# Patient Record
Sex: Female | Born: 1955 | Race: White | Hispanic: No | Smoking: Former smoker
Health system: Southern US, Community
[De-identification: ages and names within clinical notes are randomized; demographics above are authoritative.]

## PROBLEM LIST (undated history)

## (undated) DIAGNOSIS — Z87891 Personal history of nicotine dependence: Secondary | ICD-10-CM

## (undated) DIAGNOSIS — L900 Lichen sclerosus et atrophicus: Secondary | ICD-10-CM

## (undated) DIAGNOSIS — K635 Polyp of colon: Secondary | ICD-10-CM

## (undated) DIAGNOSIS — L918 Other hypertrophic disorders of the skin: Secondary | ICD-10-CM

## (undated) DIAGNOSIS — E785 Hyperlipidemia, unspecified: Secondary | ICD-10-CM

## (undated) DIAGNOSIS — N059 Unspecified nephritic syndrome with unspecified morphologic changes: Secondary | ICD-10-CM

## (undated) HISTORY — DX: Hyperlipidemia, unspecified: E78.5

## (undated) HISTORY — DX: Lichen sclerosus et atrophicus: L90.0

## (undated) HISTORY — PX: DENTAL SURGERY: SHX609

## (undated) HISTORY — DX: Personal history of nicotine dependence: Z87.891

## (undated) HISTORY — DX: Unspecified nephritic syndrome with unspecified morphologic changes: N05.9

## (undated) HISTORY — PX: COLONOSCOPY: SHX174

## (undated) HISTORY — DX: Polyp of colon: K63.5

---
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
## 1999-03-07 ENCOUNTER — Encounter (INDEPENDENT_AMBULATORY_CARE_PROVIDER_SITE_OTHER): Payer: Self-pay | Admitting: Specialist

## 1999-03-07 ENCOUNTER — Ambulatory Visit (HOSPITAL_COMMUNITY): Admission: RE | Admit: 1999-03-07 | Discharge: 1999-03-07 | Payer: Self-pay | Admitting: Internal Medicine

## 2006-10-12 ENCOUNTER — Encounter: Admission: RE | Admit: 2006-10-12 | Discharge: 2006-10-12 | Payer: Self-pay | Admitting: Family Medicine

## 2006-10-28 ENCOUNTER — Encounter: Admission: RE | Admit: 2006-10-28 | Discharge: 2006-10-28 | Payer: Self-pay | Admitting: Family Medicine

## 2006-11-23 ENCOUNTER — Ambulatory Visit (HOSPITAL_COMMUNITY): Admission: RE | Admit: 2006-11-23 | Discharge: 2006-11-23 | Payer: Self-pay | Admitting: Surgery

## 2006-11-23 ENCOUNTER — Encounter (INDEPENDENT_AMBULATORY_CARE_PROVIDER_SITE_OTHER): Payer: Self-pay | Admitting: Surgery

## 2007-02-24 HISTORY — PX: BREAST SURGERY: SHX581

## 2008-08-29 IMAGING — MG MM DUCTOGRAM UNILATERAL*L*
2 series · 2 of 2 positions shown · non-contrast
Comparison: none

BILATERAL BREAST ULTRASOUND
Technologist: Kelvil Raudales, Medical

DUCTOGRAM UNILATERAL *L*
CC and MLO view(s) were taken of the left breast.
BILATERAL  BREAST ULTRASOUND AND LEFT DUCTOGRAM:
CLINICAL DATA: 50-year-old with abnormal screening mammogram 10-12-06.

[L CC]
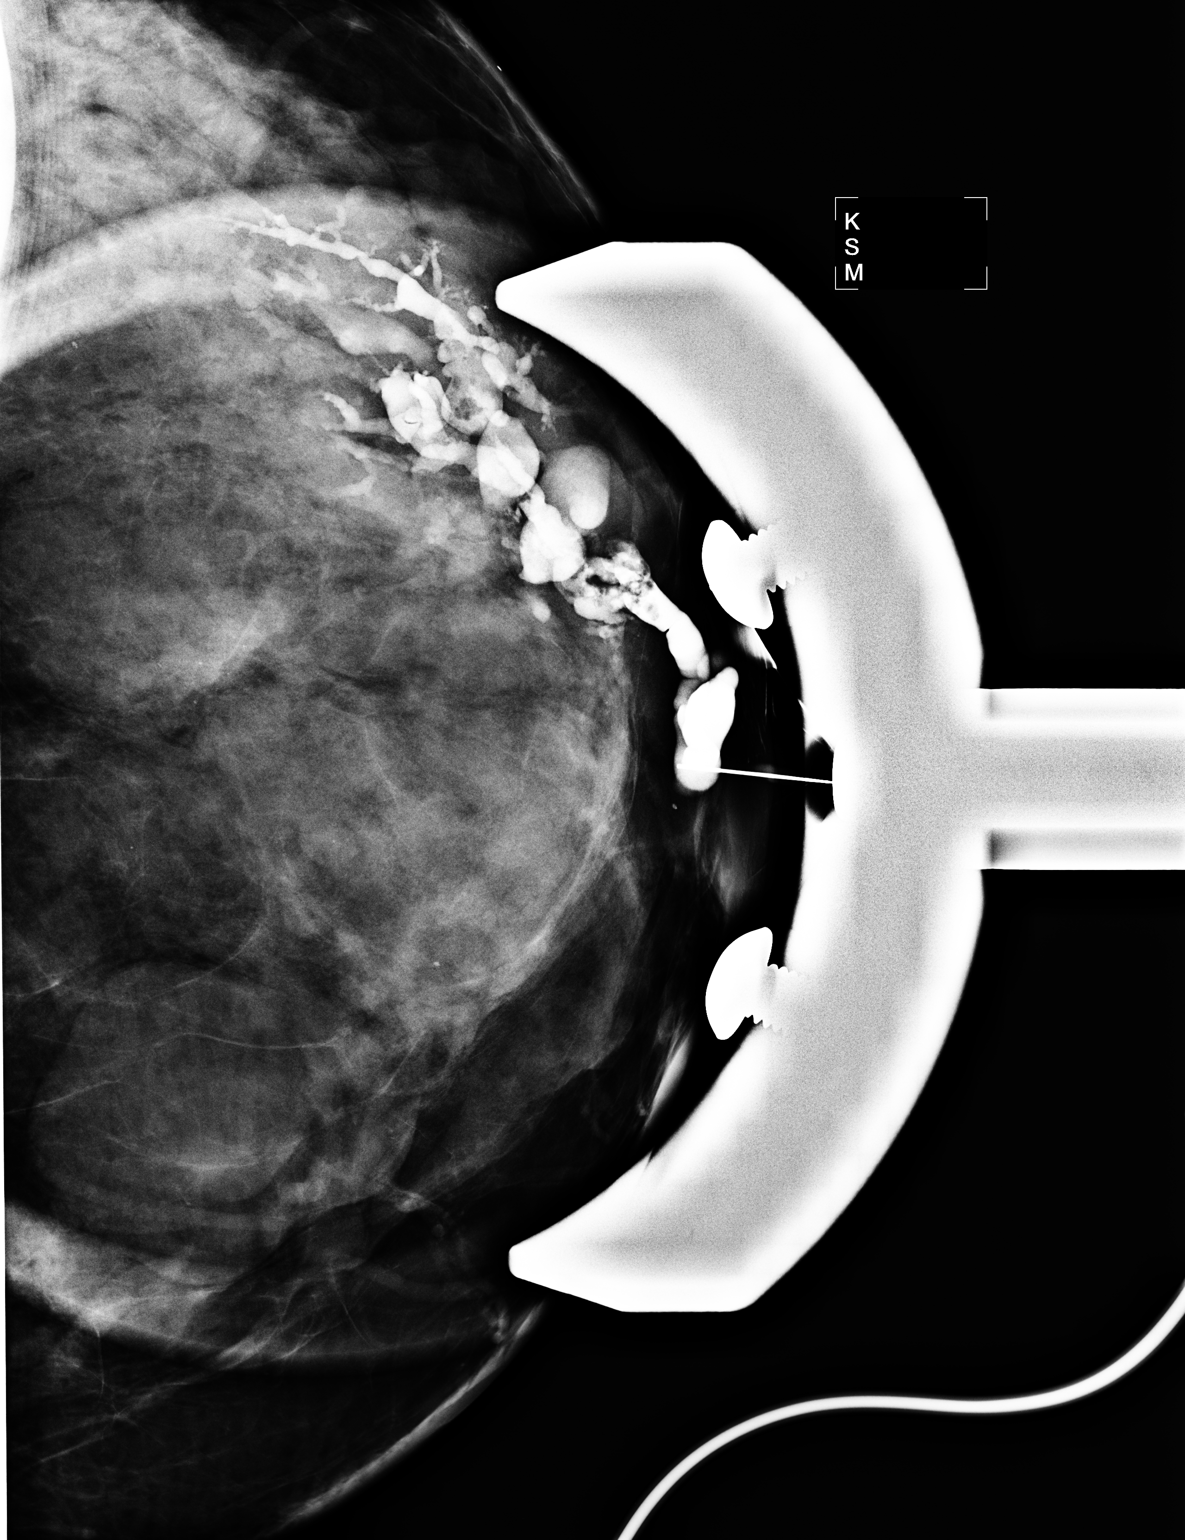

[L ML]
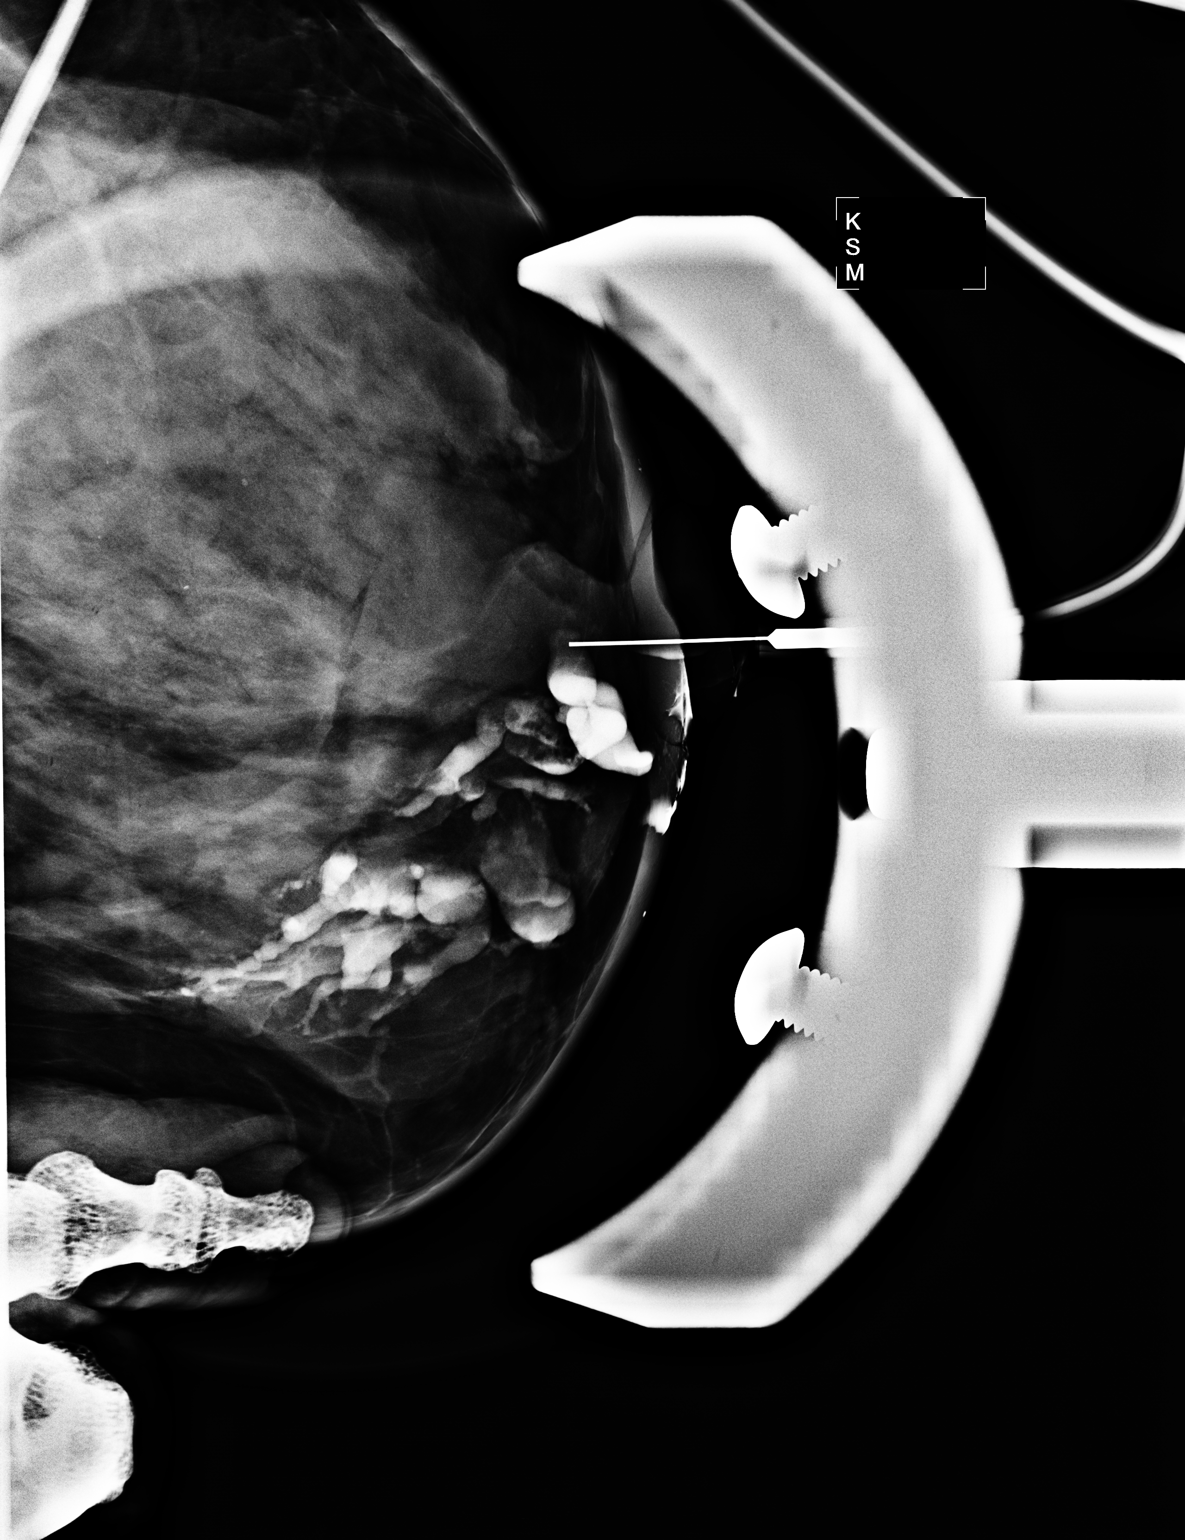

[2 of 2 positions shown; findings below may reference images not displayed]

Comparison with prior mammogram 04-02-99 from [REDACTED].

On physical exam, I palpate no discrete abnormality in either breast.  Ultrasound is performed 
bilaterally, showing numerous benign simple cysts.  The largest on the right is 1.9 cm in the 11 
o'clock position. The largest in the left is in the 10 o'clock position measuring 2.7 cm.  No solid
mass or area of acoustic shadowing is identified.

The patient reports having occasional clear spontaneous drainage on the left. This was further 
evaluated with physical exam and ductogram.  I am able to express a small amount of clear fluid 
from the 2 o'clock location of the left nipple.  Ductogram is performed, showing numerous filling 
defects within the duct system which subtends the lower outer quadrant of the left breast.  
Numerous filling defects are consistent with papillomata.  Excision is suggested.  An appointment 
was made for the patient with Dr. Gregory for surgical consultation regarding duct excision.
IMPRESSION: 1.  Numerous bilateral cysts.
2.  Filling defects within a lower outer quadrant duct system of the left breast accounting for the
spontaneous nipple discharge.  Duct excision is suggested.  Surgical consultation has been 
arranged.

ASSESSMENT: Suspicious - BI-RADS 4

Surgical consultation of the left breast.
, THIS PROCEDURE WAS A DIGITAL MAMMOGRAM.

## 2010-07-08 NOTE — Op Note (Signed)
Melinda Garner, Melinda Garner               ACCOUNT NO.:  [id number]   MEDICAL RECORD NO.:  [id number]          PATIENT TYPE:  AMB   LOCATION:  DAY                          FACILITY[facility]   PHYSICIAN:  Thomas A. Cornett, M.D.DATE OF BIRTH:  [birth year]   DATE OF PROCEDURE:  11/23/2006  DATE OF DISCHARGE:                               OPERATIVE REPORT   PREOPERATIVE DIAGNOSIS:  Bilateral nipple discharge.   POSTOPERATIVE DIAGNOSIS:  Bilateral nipple discharge.   PROCEDURE:  Bilateral central breast duct excision.   SURGEON:  Harriette Bouillon, M.D.   ANESTHESIA:  MAC with 40 mL of 0.25% Sensorcaine plain.   ESTIMATED BLOOD LOSS:  20 mL.   SPECIMEN:  Left and right breast tissue from central duct excisions to  pathology for evaluation.   DRAINS:  None.   INDICATIONS FOR PROCEDURE:  The patient is a 55 year old postmenopausal  female who has had a longstanding history of left breast serosanguineous  discharge.  A ductogram was obtained with the radiologist which showed  multiple dilated ducts and what appeared to be a filling defect in the  ducts originating from the left lower outer quadrant.  Upon examination,  I was able to elicit the same to discharge from her right breast from  multiple ducts it looked like upon palpation.  I discussed with her  options and felt the left breast tissue needed to be excised and she  wished to have the right done the same time since she has had drainage  off and on from that over many years even though she has had none  recently.  Informed consent obtained.   DESCRIPTION OF PROCEDURE:  The patient brought to the operating room,  placed supine.  After induction of MAC anesthesia, both breasts were  prepped in sterile fashion.  Left breast addressed first.  Local  anesthesia was infiltrated.  I used to a probe and I found the  appropriate duct, was able to probe it.  A curvilinear incision was made  at the junction of the areola and skin.  I was able to  dissect to the  duct and excised it and the tissue around it.  Other multiple areas of  fibrocystic change, multiple areas of small cysts and multiple ectatic  ducts going to the nipple.  I went ahead excised all these under the  nipple.  A disk of tissue was excised to a depth of about a centimeter.  All this tissue was then passed off the field.  The wound was closed in  layers using the deep layer of 3-0 Vicryl and 4-0 Monocryl in  subcuticular fashion was used to close the skin.  Dermabond was applied  as a dressing.  Hemostasis was excellent.   The right breast was approached in similar fashion.  Local anesthesia  was infiltrated at the border of areola and skin.  Curvilinear incision  was made in the inferior aspect of the nipple areolar complex.  I was  able to localize multiple ducts stalks with probes and went ahead and  excised all these.  I was able to take the  disk of central duct tissue  to about a depth of a centimeter and all the tissue under the nipple-  areolar complex was excised to that depth.  Again numerous areas of  fibrocystic change, ectatic ducts and small cysts were all encountered  in this area.  Irrigation was used and suctioned out.  Hemostasis was  excellent.  I closed in layers with 3-0 Vicryl for deep layer and 4-0  Monocryl for subcuticular stitch.  Dermabond was applied as dressing.  This tissue was sent to pathology as well.  All final counts of sponge,  needle and instruments were found to be correct.  She was taken to  recovery room in satisfactory condition after emerging from her  anesthetic.      Thomas A. Cornett, M.D.  Electronically Signed     TAC/MEDQ  D:  11/23/2006  T:  11/24/2006  Job:  [id number]   cc:   Norva Pavlov, M.D.  Fax: [phone]   Asencion Partridge, M.D.

## 2010-12-04 LAB — DIFFERENTIAL
Basophils Relative: 1
Eosinophils Absolute: 0.2
Eosinophils Relative: 2
Lymphs Abs: 2.5
Monocytes Absolute: 0.5
Monocytes Relative: 5
Neutrophils Relative %: 66

## 2010-12-04 LAB — CBC
HCT: 38.4
Hemoglobin: 13.2
MCHC: 34.3
MCV: 85.3
RBC: 4.5
WBC: 9.5

## 2014-11-16 DIAGNOSIS — Z86010 Personal history of colonic polyps: Secondary | ICD-10-CM | POA: Insufficient documentation

## 2016-07-27 ENCOUNTER — Ambulatory Visit (INDEPENDENT_AMBULATORY_CARE_PROVIDER_SITE_OTHER): Payer: No Typology Code available for payment source | Admitting: Family Medicine

## 2016-07-27 ENCOUNTER — Encounter: Payer: Self-pay | Admitting: Family Medicine

## 2016-07-27 VITALS — BP 137/86 | HR 76 | Temp 98.3°F | Resp 20 | Ht 65.0 in | Wt 181.5 lb

## 2016-07-27 DIAGNOSIS — L918 Other hypertrophic disorders of the skin: Secondary | ICD-10-CM | POA: Diagnosis not present

## 2016-07-27 DIAGNOSIS — K635 Polyp of colon: Secondary | ICD-10-CM | POA: Insufficient documentation

## 2016-07-27 DIAGNOSIS — L920 Granuloma annulare: Secondary | ICD-10-CM | POA: Diagnosis not present

## 2016-07-27 DIAGNOSIS — L235 Allergic contact dermatitis due to other chemical products: Secondary | ICD-10-CM

## 2016-07-27 DIAGNOSIS — Z6830 Body mass index (BMI) 30.0-30.9, adult: Secondary | ICD-10-CM

## 2016-07-27 DIAGNOSIS — Z0000 Encounter for general adult medical examination without abnormal findings: Secondary | ICD-10-CM | POA: Diagnosis not present

## 2016-07-27 HISTORY — DX: Other hypertrophic disorders of the skin: L91.8

## 2016-07-27 MED ORDER — TRIAMCINOLONE ACETONIDE 0.025 % EX OINT: 1.0000 "application " | TOPICAL_OINTMENT | Freq: Two times a day (BID) | CUTANEOUS | 0 refills | Status: DC

## 2016-07-27 NOTE — Patient Instructions (Addendum)
Kenalog  Cream for skin rash on hands/fingers. If this does not work will need to refer to dermatology.       Please help [place] help you:  We are honored you have chosen [facility] for your Primary Care home. Below you will find basic instructions that you may need to access in the future. Please help [place] help you by reading the instructions, which cover many of the frequent questions we experience.   Prescription refills and request:  -In order to allow more efficient response time, please call your pharmacy for all refills. They will forward the request electronically to [place]. This allows for the quickest possible response. Request left on a nurse line can take longer to refill, since these are checked as time allows between office patients and other phone calls.  - refill request can take up to 3-5 working days to complete.  - If request is sent electronically and request is appropiate, it is usually completed in 1-2 business days.  - all patients will need to be seen routinely for all chronic medical conditions requiring prescription medications (see follow-up below). If you are overdue for follow up on your condition, you will be asked to make an appointment and we will call in enough medication to cover you until your appointment (up to 30 days).  - all controlled substances will require a face to face visit to request/refill.  - if you desire your prescriptions to go through a new pharmacy, and have an active script at original pharmacy, you will need to call your pharmacy and have scripts transferred to new pharmacy. This is completed between the pharmacy locations and not by your provider.    Results: If any images or labs were ordered, it can take up to 1 week to get results depending on the test ordered and the lab/facility running and resulting the test. - Normal or stable results, which do not need further discussion, may be released to your mychart immediately with attached note to  you. A call may not be generated for normal results. Please make certain to sign up for mychart. If you have questions on how to activate your mychart you can call the front office.  - If your results need further discussion, our office will attempt to contact you via phone, and if unable to reach you after 2 attempts, we will release your abnormal result to your mychart with instructions.  - All results will be automatically released in mychart after 1 week.  - Your provider will provide you with explanation and instruction on all relevant material in your results. Please keep in mind, results and labs may appear confusing or abnormal to the untrained eye, but it does not mean they are actually abnormal for you personally. If you have any questions about your results that are not covered, or you desire more detailed explanation than what was provided, you should make an appointment with your provider to do so.   Our office handles many outgoing and incoming calls daily. If we have not contacted you within 1 week about your results, please check your mychart to see if there is a message first and if not, then contact our office.  In helping with this matter, you help decrease call volume, and therefore allow [place] to be able to respond to patients needs more efficiently.   Acute office visits (sick visit):  An acute visit is intended for a new problem and are scheduled in shorter time slots  to allow schedule openings for patients with new problems. This is the appropriate visit to discuss a new problem. In order to provide you with excellent quality medical care with proper time for you to explain your problem, have an exam and receive treatment with instructions, these appointments should be limited to one new problem per visit. If you experience a new problem, in which you desire to be addressed, please make an acute office visit, we save openings on the schedule to accommodate you. Please do not save your  new problem for any other type of visit, let us take care of it properly and quickly for you.   Follow up visits:  Depending on your condition(s) your provider will need to see you routinely in order to provide you with quality care and prescribe medication(s). Most chronic conditions (Example: hypertension, Diabetes, depression/anxiety... etc), require visits a couple times a year. Your provider will instruct you on proper follow up for your personal medical conditions and history. Please make certain to make follow up appointments for your condition as instructed. Failing to do so could result in lapse in your medication treatment/refills. If you request a refill, and are overdue to be seen on a condition, we will always provide you with a 30 day script (once) to allow you time to schedule.    Medicare wellness (well visit): - we have a wonderful Nurse Selena Batten(Kim), that will meet with you and provide you will yearly medicare wellness visits. These visits should occur yearly (can not be scheduled less than 1 calendar year apart) and cover preventive health, immunizations, advance directives and screenings you are entitled to yearly through your medicare benefits. Do not miss out on your entitled benefits, this is when medicare will pay for these benefits to be ordered for you.  These are strongly encouraged by your provider and is the appropriate type of visit to make certain you are up to date with all preventive health benefits. If you have not had your medicare wellness exam in the last 12 months, please make certain to schedule one by calling the office and schedule your medicare wellness with Selena BattenKim as soon as possible.   Yearly physical (well visit):  - Adults are recommended to be seen yearly for physicals. Check with your insurance and date of your last physical, most insurances require one calendar year between physicals. Physicals include all preventive health topics, screenings, medical exam and labs  that are appropriate for gender/age and history. You may have fasting labs needed at this visit. This is a well visit (not a sick visit), new problems should not be covered during this visit (see acute visit).  - Pediatric patients are seen more frequently when they are younger. Your provider will advise you on well child visit timing that is appropriate for your their age. - This is not a medicare wellness visit. Medicare wellness exams do not have an exam portion to the visit. Some medicare companies allow for a physical, some do not allow a yearly physical. If your medicare allows a yearly physical you can schedule the medicare wellness with our nurse Selena BattenKim and have your physical with your provider after, on the same day. Please check with insurance for your full benefits.   Late Policy/No Shows:  - all new patients should arrive 15-30 minutes earlier than appointment to allow us time  to  obtain all personal demographics,  insurance information and for you to complete office paperwork. - All established patients should arrive 10-15  minutes earlier than appointment time to update all information and be checked in .  - In our best efforts to run on time, if you are late for your appointment you will be asked to either reschedule or if able, we will work you back into the schedule. There will be a wait time to work you back in the schedule,  depending on availability.  - If you are unable to make it to your appointment as scheduled, please call 24 hours ahead of time to allow [place] to fill the time slot with someone else who needs to be seen. If you do not cancel your appointment ahead of time, you may be charged a no show fee.

## 2016-07-27 NOTE — Progress Notes (Signed)
Patient ID: Melinda Garner, female  DOB: Jul 02, 1955, 61 y.o.   MRN: [id number] Patient Care Team    Relationship Specialty Notifications Start End  Natalia Leatherwood, DO PCP - General Family Medicine  07/27/16     Chief Complaint  Patient presents with  . Establish Care  . Rash    possible reaction to fabric softner    Subjective:  Melinda Garner is a 61 y.o.  female present for new patient establishment. All past medical history, surgical history, allergies, family history, immunizations, medications and social history were obtained and updated in the electronic medical record today. All recent labs, ED visits and hospitalizations within the last year were reviewed. Most information was obtained by extensive research through care everywhere, no physical records available.   Patient reports she did not have health insurance for a few years secondary to her divorce. She believes the last CPE was around 2015.    Rash: pt has a fine rash over her abdomen which she states started after changing her fabric softener to a different scent. She reports very sensitive skin. This has happened to her in the past. She is currently using cortisone cream, and states it has improved.   Hand lesions: Pt has ~6 hand lesions, 1 on her left index and the others on right posterior hand. They  Have been present and unchanged for 3 years. She was assured by her prior PCP they were not ring worm. They are asymptomatic to her. She puts cortisone cream on them. They ring/oval like lesions .  Skin tags: Pt has multiple skin tags surrounding her neck. She reports many of them get caught and cause her pain. She would like to have them removed.   BMI > 30: Pt reports she is unable to lose weight and wonders if there is anything she could do. She does exercise but nor routinely. She does not watch her caloric intake.   Health maintenance:  Colonoscopy: unknown requesting records, look like due 2019. Fhx  present in father and personal h/o colon polyps.  Mammogram: 07/14/2013, birads 42, 2008 mammogram --> Multiple cysts (see below) Cervical cancer screening: last pap: 05/2013. Neg/neg co-test HPV Immunizations: tdap 07/2013, Influenza declines (encouraged yearly), PNA series declines, zostavax declines--> pt does not believe in immunizations.  Infectious disease screening: HIV and Hep C unknown.  DEXA: unknown.   Mammogram 2008: --> had surgery for cyst removal.  IMPRESSION: 1.  Numerous bilateral cysts. 2.  Filling defects within a lower outer quadrant duct system of the left breast accounting for the spontaneous nipple discharge.  Duct excision is suggested.  Surgical consultation has been  arranged.  Depression screen PHQ 2/9 07/27/2016  Decreased Interest 0  Down, Depressed, Hopeless 0  PHQ - 2 Score 0   No flowsheet data found.    Current Exercise Habits: The patient does not participate in regular exercise at present Exercise limited by: None identified Fall Risk  07/27/2016  Falls in the past year? No   Immunization History  Administered Date(s) Administered  . Tdap 07/25/2013    No exam data present  Past Medical History:  Diagnosis Date  . Chronic kidney disease    nephritis   Allergies  Allergen Reactions  . Escitalopram Diarrhea  . Other Other (See Comments)    Food allergies Green Peppers (Vomiting/Diarrhea), Kiwi (restricts breathing)   Past Surgical History:  Procedure Laterality Date  . APPENDECTOMY    . BREAST SURGERY  cyst removal   Family History  Problem Relation Age of Onset  . Heart disease Mother   . Stroke Mother   . Early death Mother   . Colon cancer Father   . Early death Father   . Aneurysm Father   . Diabetes Brother   . Early death Brother   . Heart disease Brother   . COPD Paternal Uncle   . Heart disease Paternal Uncle   . Prostate cancer Brother    Social History   Social History  . Marital status: Married    Spouse  name: N/A  . Number of children: N/A  . Years of education: N/A   Occupational History  . Not on file.   Social History Main Topics  . Smoking status: Former Smoker    Packs/day: 1.00    Years: 30.00    Types: Cigarettes    Quit date: 07/13/2010  . Smokeless tobacco: Never Used  . Alcohol use Yes     Comment: occasional  . Drug use: No  . Sexual activity: No   Other Topics Concern  . Not on file   Social History Narrative  . No narrative on file   Allergies as of 07/27/2016      Reactions   Escitalopram Diarrhea   Other Other (See Comments)   Food allergies Green Peppers (Vomiting/Diarrhea), Kiwi (restricts breathing)      Medication List       Accurate as of 07/27/16 10:14 AM. Always use your most recent med list.          chlorhexidine 0.12 % solution Commonly known as:  PERIDEX RM WITH 15 ML BID FOR 10 DAYS   UNABLE TO FIND Stress Care 1 tablet prn   UNABLE TO FIND Elderberry Defense (Seasonal)   UNABLE TO FIND Med Name: Tei-Fu Essential Oils       All past medical history, surgical history, allergies, family history, immunizations andmedications were updated in the EMR today and reviewed under the history and medication portions of their EMR.    No results found for this or any previous visit (from the past 2160 hour(s)).  No results found.   ROS: 14 pt review of systems performed and negative (unless mentioned in an HPI)  Objective: BP 137/86 (BP Location: Right Arm, Patient Position: Sitting, Cuff Size: Small)   Pulse 76   Temp 98.3 F (36.8 C)   Resp 20   Ht 5\' 5"  (1.651 m)   Wt 181 lb 8 oz (82.3 kg)   SpO2 97%   BMI 30.20 kg/m  Gen: Afebrile. No acute distress. Nontoxic in appearance, well-developed, well-nourished,  Obese, caucasian female.  HENT: AT. [place].  MMM Eyes:Pupils Equal Round Reactive to light, Extraocular movements intact,  Conjunctiva without redness, discharge or icterus. Neck/lymp/endocrine: Supple,no lymphadenopathy,  multiple skin tags present.  CV: RRR no murmur, no edema, +2/4 P posterior tibialis pulses. no carotid bruits Chest: CTAB, no wheeze, rhonchi or crackles.  Abd: Soft.obese. NTND. BS present.  Skin: multiple oval shaped raised ring like lesions hands.  NO purpura or petechiae. Warm and well-perfused. Skin intact. Neuro/Msk:  Normal gait. PERLA. EOMi. Alert. Oriented x3.   Psych: Normal affect, dress and demeanor. Normal speech. Normal thought content and judgment.   Assessment/plan: Melinda Garner is a 61 y.o. female present for establish care.  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  - triamcinolone (KENALOG) 0.025 % ointment; Apply 1 application topically 2 (two) times daily.  Dispense: 30 g; Refill: 0 - discussed with pt if steroid cream is not affective, would then need referral to dermatology. Per prior records in care everywhere tab, it had been treated as ring worm with antifungal without resolution.  Allergic dermatitis due to other chemical product - will continue to use cortisone. Stop use of new fabric softener. - use OTC antihistamine if desired.   Polyp of colon, unspecified part of colon, unspecified type - pt uncertain location or year of her last colonoscopy. Fhx present. Prior h/o of colon polyps.   Skin tags, multiple acquired - discussed insurances do not always pay for skin tag removal. I would have her check with insurance and/or billing prior to scheduling so she is aware of out of pocket cost before proceeding. Multiple skin tags surrounding neck line.   BMI 30.0-30.9,adult She has many questions surrounding diet and exercise. Briefly explained calorie calculator. Phone apps. She has a fit bit. Exercise > 150 min a week. Can go into more detail and offer PREP program etc on physical, time did not allow additional issues to be discussed on establishment visit.   - Discussed she should make an appt for her CPE to have her  yearly lab work and preventive health maintenance completed --> would benefit from records prior to CPE Return in about 4 weeks (around 08/24/2016) for CPE.   Note is dictated utilizing voice recognition software. Although note has been proof read prior to signing, occasional typographical errors still can be missed. If any questions arise, please do not hesitate to call for verification.  Electronically signed by: Felix Pacinienee Demondre Aguas, DO [facility]

## 2016-08-28 ENCOUNTER — Encounter: Payer: No Typology Code available for payment source | Admitting: Family Medicine

## 2016-09-08 ENCOUNTER — Encounter: Payer: No Typology Code available for payment source | Admitting: Family Medicine

## 2017-03-09 ENCOUNTER — Other Ambulatory Visit (HOSPITAL_COMMUNITY)
Admission: RE | Admit: 2017-03-09 | Discharge: 2017-03-09 | Disposition: A | Discharge status: Home / Self Care | Payer: Self-pay | Source: Ambulatory Visit | Attending: Family Medicine | Admitting: Family Medicine

## 2017-03-09 ENCOUNTER — Encounter: Payer: Self-pay | Admitting: Family Medicine

## 2017-03-09 ENCOUNTER — Ambulatory Visit: Payer: Self-pay | Admitting: Family Medicine

## 2017-03-09 VITALS — BP 124/82 | HR 83 | Temp 97.9°F | Resp 20 | Wt 169.0 lb

## 2017-03-09 DIAGNOSIS — N898 Other specified noninflammatory disorders of vagina: Secondary | ICD-10-CM

## 2017-03-09 MED ORDER — NYSTATIN-TRIAMCINOLONE 100000-0.1 UNIT/GM-% EX OINT
1.0000 | TOPICAL_OINTMENT | Freq: Two times a day (BID) | CUTANEOUS | 0 refills | Status: DC
Start: 2017-03-09 — End: 2017-06-18

## 2017-03-09 MED ORDER — FLUCONAZOLE 150 MG PO TABS: 150.0000 mg | ORAL_TABLET | Freq: Once | ORAL | 0 refills | Status: AC

## 2017-03-09 NOTE — Patient Instructions (Signed)
Take the diflucan pill today.  Stop all topical applications, you can use the cream we have prescribed for itching until we get the cultures back to treat.

## 2017-03-09 NOTE — Progress Notes (Signed)
Melinda Garner , Jun 15, 1955, 62 y.o., female MRN: [id number] Patient Care Team    Relationship Specialty Notifications Start End  Melinda Garner, Renee A, DO PCP - General Family Medicine  07/27/16   Melinda Garner, Brian, DO  Ophthalmology  07/27/16    Comment: [facility]- Melinda Garner    Chief Complaint  Patient presents with  . Vaginal Itching    for several months     Subjective: Pt presents for an OV with complaints of vaginal itching of 7 months  duration.  Associated symptoms include external itching, no discharge, fever or abd pain. It wakes her from sleep. She is scratching herself and causing bruising and bleeding from the itching.  Pt has tried homeopathic treatments/oils, coconut oil, yogurt application and probiotics to ease their symptoms. Nothing is making her discomfort better.  Pt currently does not have insurance and is self pay.   Depression screen PHQ 2/9 07/27/2016  Decreased Interest 0  Down, Depressed, Hopeless 0  PHQ - 2 Score 0    Allergies  Allergen Reactions  . Escitalopram Diarrhea  . Other Other (See Comments)    Food allergies Green Peppers (Vomiting/Diarrhea), Kiwi (restricts breathing)   Social History   Tobacco Use  . Smoking status: Former Smoker    Packs/day: 1.00    Years: 30.00    Pack years: 30.00    Types: Cigarettes    Last attempt to quit: 07/13/2010    Years since quitting: 6.6  . Smokeless tobacco: Never Used  Substance Use Topics  . Alcohol use: Yes    Comment: occasional   Past Medical History:  Diagnosis Date  . Colon polyps    pt unsure on dates, prior records only indicate due 2019  . Former smoker    quit 2012  . Hyperlipidemia   . Lichen sclerosus   . Nephritis    Past Surgical History:  Procedure Laterality Date  . APPENDECTOMY  1962  . BREAST SURGERY  2009   cyst removal  . COLONOSCOPY     prior records indicate polyps and fhx, due 2019  . DENTAL SURGERY  1972 and 2018   Family History  Problem Relation Age of Onset    . Heart disease Mother   . Stroke Mother   . Early death Mother   . Colon cancer Father [id number]  . Early death Father   . Aneurysm Father        brain  . Diabetes Brother   . Early death Brother   . Heart disease Brother   . COPD Paternal Uncle   . Heart disease Paternal Uncle   . Prostate cancer Brother    Allergies as of 03/09/2017      Reactions   Escitalopram Diarrhea   Other Other (See Comments)   Food allergies Green Peppers (Vomiting/Diarrhea), Kiwi (restricts breathing)      Medication List        Accurate as of 03/09/17  8:23 AM. Always use your most recent med list.          chlorhexidine 0.12 % solution Commonly known as:  PERIDEX RM WITH 15 ML BID FOR 10 DAYS   triamcinolone 0.025 % ointment Commonly known as:  KENALOG Apply 1 application topically 2 (two) times daily.   UNABLE TO FIND Stress Care 1 tablet prn   UNABLE TO FIND Elderberry Defense (Seasonal)   UNABLE TO FIND Med Name: Tei-Fu Essential Oils       All past medical history, surgical history, allergies,  family history, immunizations andmedications were updated in the EMR today and reviewed under the history and medication portions of their EMR.     ROS: Negative, with the exception of above mentioned in HPI   Objective:  BP 124/82 (BP Location: Right Arm, Patient Position: Sitting, Cuff Size: Normal)   Pulse 83   Temp 97.9 F (36.6 C)   Resp 20   Wt 169 lb (76.7 kg)   SpO2 98%   BMI 28.12 kg/m  Body mass index is 28.12 kg/m. Gen: Afebrile. No acute distress. Nontoxic in appearance, well developed, well nourished.  HENT: AT. [place].  MMM Eyes:Pupils Equal Round Reactive to light, Extraocular movements intact,  Conjunctiva without redness, discharge or icterus.  Abd: Soft. NTND. BS present.  GYN:  External genitalia with erythema, pea size ulceration/excoriation right L. [place]. Multiple smaller areas of excoriation present. normal hair distribution. Urethral meatus normal, no lesions.  Vaginal mucosa pink, moist, normal rugae, no lesions.  cervix without lesions, no discharge. No bladder/suprapubic fullness, masses or tenderness. No cervical motion tenderness. No adnexal fullness. Anus and perineum within normal limits, no lesions.  No exam data present No results found. No results found for this or any previous visit (from the past 24 hour(s)).  Assessment/Plan: Melinda Garner is a 62 y.o. female present for OV for  Vaginal itching - pts exam today is positive for small excoriated area likely from itching. Concern for infection in these areas. Mild generalized erythema present. Wet prep completed, will wait for results before treating for superinfection. - Diflucan prescribed. Nystatin triamcinolone cream prescribed for comfort. - She is to stop using all other topical solutions, creams and oils. Although she thinks these were helping maintain her vaginal health, have concerns they are causing an infection. - Cervicovaginal ancillary only - Follow-up depending upon lab results.     Reviewed expectations re: course of current medical issues.  Discussed self-management of symptoms.  Outlined signs and symptoms indicating need for more acute intervention.  Patient verbalized understanding and all questions were answered.  Patient received an After-Visit Summary.    No orders of the defined types were placed in this encounter.    Note is dictated utilizing voice recognition software. Although note has been proof read prior to signing, occasional typographical errors still can be missed. If any questions arise, please do not hesitate to call for verification.   electronically signed by:  Melinda Pacini, DO  [facility] - [place]

## 2017-03-10 ENCOUNTER — Telehealth: Payer: Self-pay | Admitting: Family Medicine

## 2017-03-10 DIAGNOSIS — N952 Postmenopausal atrophic vaginitis: Secondary | ICD-10-CM

## 2017-03-10 LAB — CERVICOVAGINAL ANCILLARY ONLY
Bacterial vaginitis: NEGATIVE
CHLAMYDIA, DNA PROBE: NEGATIVE
Candida vaginitis: NEGATIVE
Neisseria Gonorrhea: NEGATIVE
Trichomonas: NEGATIVE

## 2017-03-10 MED ORDER — ESTRADIOL 0.1 MG/GM VA CREA: TOPICAL_CREAM | VAGINAL | 5 refills | Status: AC

## 2017-03-10 NOTE — Telephone Encounter (Signed)
Please call patient: Her wet prep did not show any signs of bacterial or parasitic infections. 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 This is treated with a topical estrogen therapy. Her condition is complicated by the fact of her scratching causing more irritation and burning. - I have called in estrogen cream to be used nightly for 2 weeks, then 2 times a week. There will be an applicator for her to answer the cream vaginally. Estrogen will allow healing and maintain vaginal health with continued use. - I would recommend she avoid all topical treatments she has used prior. If the cream provided to her during her office visit has been helpful she can continue short-term for comfort. - Follow-up in 4 weeks, sooner if needed.

## 2017-03-11 ENCOUNTER — Telehealth: Payer: Self-pay | Admitting: Family Medicine

## 2017-03-11 NOTE — Telephone Encounter (Signed)
Pt called for results of wet prep. No CRM. Spoke with Vernona RiegerLaura at practice who conferenced with patient.

## 2017-03-11 NOTE — Telephone Encounter (Signed)
Patient notified and verbalized understanding. 

## 2017-03-23 ENCOUNTER — Telehealth: Payer: Self-pay | Admitting: *Deleted

## 2017-03-23 NOTE — Telephone Encounter (Signed)
Medication is dosed appropriately by evidence-based medicine. She certainly can decide to take every other day for the first 14 days, however she might not receive as good as a benefit taking that way. That is her decision. Most importantly she is to not over take it, and making sure to stick to the recommendations of only taking twice weekly after those 14 days.

## 2017-03-23 NOTE — Telephone Encounter (Signed)
Patient called and states her estradiol vaginal cream is expensive she wants to know if she can just use this every other day instead of her current directions of 1 application nightly for 14 days then 1/2 application 2 times weekly. Please advise.

## 2017-03-23 NOTE — Telephone Encounter (Signed)
Left detailed message with  instructions on patient voice mail per DPR 

## 2017-06-17 ENCOUNTER — Telehealth: Payer: Self-pay | Admitting: Family Medicine

## 2017-06-17 NOTE — Telephone Encounter (Signed)
Patient requesting refill of  nystatin-triamcinolone ointment (MYCOLOG) 15gm.  She states she can no longer afford the Estradiol .01% vaginal cream 42.5gm.  She wants to know if pcp can recommend another medication that is a cheaper alternative to estradiol.     Pharmacy:   [facility] [id number] - [place] - [address] [facility] [address] [phone] (Phone) [phone] (Fax)

## 2017-06-18 MED ORDER — NYSTATIN-TRIAMCINOLONE 100000-0.1 UNIT/GM-% EX OINT: 1.0000 "application " | TOPICAL_OINTMENT | Freq: Two times a day (BID) | CUTANEOUS | 5 refills | Status: DC

## 2017-06-18 NOTE — Telephone Encounter (Signed)
I refilled the cream she requested. ALL estrogen creams are expensive if not covered by insurance. She can look on [facility]RX to see if she can find it cheaper.  We can also refer to GYN for further recs if desired.

## 2017-06-18 NOTE — Telephone Encounter (Signed)
Spoke with patient reviewed information patient verbalized understanding. 

## 2018-07-15 ENCOUNTER — Telehealth: Payer: Self-pay | Admitting: Family Medicine

## 2018-07-15 NOTE — Telephone Encounter (Signed)
Copied from CRM [id number]. Topic: Quick Communication - Rx Refill/Question >> Jul 15, 2018 11:37 AM Elliot Gault wrote: Medication: triamcinolone (KENALOG) 0.025 % ointment , nystatin-triamcinolone ointment (MYCOLOG) (patient completely out)    Preferred Pharmacy (with phone number or street name):  [id number] - [place], [place] - [address] AT [facility] OF [address] [phone] (Phone) [phone] (Fax)    Agent: Please be advised that RX refills may take up to 3 business days. We ask that you follow-up with your pharmacy.

## 2018-07-15 NOTE — Telephone Encounter (Signed)
Pt was called and told we could not refill medication until she had Appt as we have not seen her since 02/2017. Pt was very upset that we had to see her when "nothing has changed". Pt did set up virtual visit, was explained cost of appt and how that was billed to her, she verbalized understanding

## 2018-07-20 ENCOUNTER — Other Ambulatory Visit: Payer: Self-pay

## 2018-07-20 ENCOUNTER — Encounter: Payer: Self-pay | Admitting: Family Medicine

## 2018-07-20 ENCOUNTER — Ambulatory Visit (INDEPENDENT_AMBULATORY_CARE_PROVIDER_SITE_OTHER): Payer: Self-pay | Admitting: Family Medicine

## 2018-07-20 VITALS — Ht 65.5 in

## 2018-07-20 DIAGNOSIS — L920 Granuloma annulare: Secondary | ICD-10-CM

## 2018-07-20 DIAGNOSIS — Z6830 Body mass index (BMI) 30.0-30.9, adult: Secondary | ICD-10-CM

## 2018-07-20 DIAGNOSIS — Z800 Family history of malignant neoplasm of digestive organs: Secondary | ICD-10-CM | POA: Insufficient documentation

## 2018-07-20 DIAGNOSIS — N952 Postmenopausal atrophic vaginitis: Secondary | ICD-10-CM | POA: Insufficient documentation

## 2018-07-20 MED ORDER — NYSTATIN-TRIAMCINOLONE 100000-0.1 UNIT/GM-% EX OINT: 1.0000 "application " | TOPICAL_OINTMENT | Freq: Two times a day (BID) | CUTANEOUS | 5 refills | Status: AC

## 2018-07-20 MED ORDER — TRIAMCINOLONE ACETONIDE 0.025 % EX OINT: 1.0000 "application " | TOPICAL_OINTMENT | Freq: Two times a day (BID) | CUTANEOUS | 3 refills | Status: AC

## 2018-07-20 NOTE — Progress Notes (Signed)
VIRTUAL VISIT VIA VIDEO  I connected with Melinda Garner on 07/20/18 at  9:00 AM EDT by a video enabled telemedicine application and verified that I am speaking with the correct person using two identifiers. Location patient: Home Location provider: [facility], Office Persons participating in the virtual visit: Patient, Dr. Claiborne Billings and R.Baker, LPN  I discussed the limitations of evaluation and management by telemedicine and the availability of in person appointments. The patient expressed understanding and agreed to proceed.   SUBJECTIVE Chief Complaint  Patient presents with  . Vaginal Itching    Needs medications refilled. Does not get GYN exams due to not having insurance. Has not had mammogram due to insurance    HPI:  Melinda Garner is a 63 y.o. female present for chronic conditions. Pt does not have insurance.  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 Needs refills  Atrophic vaginitis Well controlled with MACApause and use of nystatin- triamcinolone cream when needed. Prior note: Pt presents for an OV with complaints of vaginal itching of 7 months  duration.  Associated symptoms include external itching, no discharge, fever or abd pain. It wakes her from sleep. She is scratching herself and causing bruising and bleeding from the itching.  Pt has tried homeopathic treatments/oils, coconut oil, yogurt application and probiotics to ease their symptoms. Nothing is making her discomfort better.  Pt currently does not have insurance and is self pay.   ROS: See pertinent positives and negatives per HPI.  Patient Active Problem List   Diagnosis Date Noted  . 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 Skin tags, multiple acquired 07/27/2016  . BMI 30.0-30.9,adult 07/27/2016  . Colon polyps     Social History   Tobacco Use  . Smoking status: Former Smoker    Packs/day: 1.00    Years: 30.00    Pack years: 30.00    Types: Cigarettes    Last  attempt to quit: 07/13/2010    Years since quitting: 8.0  . Smokeless tobacco: Never Used  Substance Use Topics  . Alcohol use: Yes    Comment: occasional    Current Outpatient Medications:  Marland Kitchen  Maca Root (FEMMENESSENCE MACAPAUSE) 500 MG CAPS, Take 1 capsule by mouth daily., Disp: , Rfl:  .  Multiple Vitamin (MULTIVITAMIN) capsule, Take by mouth., Disp: , Rfl:  .  nystatin-triamcinolone ointment (MYCOLOG), Apply 1 application topically 2 (two) times daily., Disp: 30 g, Rfl: 5 .  Omega-3 1000 MG CAPS, Take by mouth., Disp: , Rfl:  .  triamcinolone (KENALOG) 0.025 % ointment, Apply 1 application topically 2 (two) times daily., Disp: 30 g, Rfl: 0 .  UNABLE TO FIND, Elderberry Defense (Seasonal), Disp: , Rfl:  .  UNABLE TO FIND, Med Name: Tei-Fu Essential Oils, Disp: , Rfl:  .  UNABLE TO FIND, Stress Care 1 tablet prn, Disp: , Rfl:   Allergies  Allergen Reactions  . Escitalopram Diarrhea  . Other Other (See Comments)    Food allergies Green Peppers (Vomiting/Diarrhea), Kiwi (restricts breathing)  . Pollen Extract Other (See Comments)    OBJECTIVE: Ht 5' 5.5" (1.664 m)   BMI 27.70 kg/m  Gen: No acute distress. Nontoxic in appearance.  HENT: AT. [place].  MMM.  Eyes:Pupils Equal Round Reactive to light, Extraocular movements intact,  Conjunctiva without redness, discharge or icterus. Chest: Cough or shortness of breath not present  Skin: hand rash present,no urpura or petechiae.  Neuro:Alert. Oriented x3  Psych: Normal affect, dress and demeanor. Normal speech. Normal  thought content and judgment.  ASSESSMENT AND PLAN: Melinda Harriesorma [name] is a 63 y.o. female present for  Granuloma annulare Stable with use of Kenalog cream with for flares. - triamcinolone (KENALOG) 0.025 % ointment; Apply 1 application topically 2 (two) times daily.  Dispense: 15 g; Refill: 3  BMI 30.0-30.9,adult Diet and exercise discussed.  Atrophic vaginitis Overall controlled with over-the-counter supplement.   Occasional use of nystatin/Kenalog cream for symptom control.  Refills provided to her today.  Follow-up yearly needed if needing refills.  > 15 minutes spent with patient, >50% of time spent face to face    Melinda Pacinienee Kuneff, DO 07/20/2018

## 2018-10-19 ENCOUNTER — Encounter: Payer: Self-pay | Admitting: Family Medicine

## 2018-10-20 NOTE — Telephone Encounter (Signed)
Called patient back about infection in her mouth. Dr. Raoul Pitch doesn't have any opening and will be gone the rest of the day. Patient is okay with seeing someone else. Transferred the call to scheduling to get her in somewhere to have her infection looked at. Patient was frustrated that she needed a visit of some sort instead of getting antibiotics called in

## 2018-10-20 NOTE — Telephone Encounter (Signed)
Called patient and lvm to call me back. Patient is requesting abx for a possible infection. She will need to be seen

## 2018-10-20 NOTE — Telephone Encounter (Signed)
If patient has a dentist she could be seen there.

## 2018-10-21 ENCOUNTER — Ambulatory Visit: Payer: Self-pay | Admitting: Family Medicine

## 2018-10-21 ENCOUNTER — Encounter: Payer: Self-pay | Admitting: Family Medicine

## 2018-10-21 VITALS — BP 132/75 | HR 78 | Temp 97.9°F | Resp 17 | Ht 66.0 in | Wt 175.5 lb

## 2018-10-21 DIAGNOSIS — K047 Periapical abscess without sinus: Secondary | ICD-10-CM

## 2018-10-21 MED ORDER — CHLORHEXIDINE GLUCONATE 0.12 % MT SOLN: 15.0000 mL | Freq: Two times a day (BID) | OROMUCOSAL | 1 refills | Status: AC

## 2018-10-21 MED ORDER — AMOXICILLIN-POT CLAVULANATE 875-125 MG PO TABS: 1.0000 | ORAL_TABLET | Freq: Two times a day (BID) | ORAL | 0 refills | Status: DC

## 2018-10-21 NOTE — Progress Notes (Signed)
Melinda Garner , 07-20-1955, 63 y.o., female MRN: [id number] Patient Care Team    Relationship Specialty Notifications Start End  Natalia LeatherwoodKuneff, Renee A, DO PCP - General Family Medicine  07/27/16   Arnoldo LenisHarris, Brian, DO  Ophthalmology  07/27/16    Comment: [facility] [place]    Chief Complaint  Patient presents with  . Mouth infection    Pt has bridge that is connected to tooth that has bone loss. This tooth/gums are infected.      Subjective: Pt presents for an OV with complaints of right sided mouth infection of 1 week  duration.  Associated symptoms include pain, gum redness and pus-like drainage. She states she has had problems with her bridge in the upper right side of her mouth becoming infected and receeding gums at that location since April. She has been flossing, brushing, water picking, rinsing with Listerine and "squeezing" the area. She reports it will improve and then restart symptoms. [facility] since she does not have insurance and is on the wait list for an appt. She has been taking advil for pain. No fever or chills.    [facility] 07/20/2018 07/27/2016  Decreased Interest 0 0  Down, Depressed, Hopeless 0 0  PHQ - 2 Score 0 0    Allergies  Allergen Reactions  . Escitalopram Diarrhea  . Other Other (See Comments)    Food allergies Green Peppers (Vomiting/Diarrhea), Kiwi (restricts breathing)  . Pollen Extract Other (See Comments)   Social History   Social History Narrative   Divorced. Three children Jobe Markeravid, Daryl and [place].    BA degree, works as a Merchandiser, retailsupervisor at [employer].    Drinks caffeine. Takes many herbal remedies. Daily vitamin use.    Tries to exercise routinely.    Wears seatbelt, bicycle helmet, smoke detector in the home.    Feels safe in her relationships.          Past Medical History:  Diagnosis Date  . Colon polyps    pt unsure on dates, prior records only indicate due 2019  . Former smoker    quit 2012  . Hyperlipidemia   . Lichen sclerosus   . Nephritis   . Skin tags, multiple acquired 07/27/2016   Past Surgical History:  Procedure Laterality Date  . APPENDECTOMY  1962  . BREAST SURGERY  2009   cyst removal  . COLONOSCOPY     prior records indicate polyps and fhx, due 2019  . DENTAL SURGERY  1972 and 2018   Family History  Problem Relation Age of Onset  . Heart disease Mother   . Stroke Mother   . Early death Mother   . Colon cancer Father [id number]  . Early death Father   . Aneurysm Father        brain  . Diabetes Brother   . Early death Brother   . Heart disease Brother   . COPD Paternal Uncle   . Heart disease Paternal Uncle   . Prostate cancer Brother    Allergies as of 10/21/2018      Reactions   Escitalopram Diarrhea   Other Other (See Comments)   Food allergies Green Peppers (Vomiting/Diarrhea), Kiwi (restricts breathing)   Pollen Extract Other (See Comments)      Medication List       Accurate as of October 21, 2018  9:44 AM. If you have any questions, ask your nurse or doctor.  Femmenessence MacaPause 500 MG Caps Generic drug: Maca Root Take 1 capsule by mouth daily.   GOLDEN SEAL EXTRACT PO   multivitamin capsule Take by mouth.   nystatin-triamcinolone ointment Commonly known as: MYCOLOG Apply 1 application topically 2 (two) times daily.   Omega-3 1000 MG Caps Take by mouth.   PROBIOTIC ADVANCED PO Take 1 tablet by mouth daily.   triamcinolone 0.025 % ointment Commonly known as: KENALOG Apply 1 application topically 2 (two) times daily.   UNABLE TO FIND Stress Care 1 tablet prn   UNABLE TO FIND Elderberry Defense (Seasonal)   UNABLE TO FIND Med Name: Tei-Fu Essential Oils       All past medical history, surgical history, allergies, family history, immunizations andmedications were updated in the EMR today and reviewed under the history and medication portions of their EMR.     ROS: Negative, with the exception of  above mentioned in HPI   Objective:  BP 132/75 (BP Location: Right Arm, Patient Position: Sitting, Cuff Size: Normal)   Pulse 78   Temp 97.9 F (36.6 C) (Temporal)   Resp 17   Ht 5\' 6"  (1.676 m)   Wt 175 lb 8 oz (79.6 kg)   SpO2 98%   BMI 28.33 kg/m  Body mass index is 28.33 kg/m. Gen: Afebrile. No acute distress. Nontoxic in appearance, well developed, well nourished.  HENT: AT. [place]. MMM. Small abscess visualized above right eye tooth with erythema and tenderness. Redness superior and posterior. Pain with bite. Mod- sev plaque build up molars/bridge right upper.  No LAD- but reported TTP right ant. Cervical .   No exam data present No results found. No results found for this or any previous visit (from the past 24 hour(s)).  Assessment/Plan: Melinda Garner is a 63 y.o. female present for OV for  Tooth infection Pt was strongly encourage to seek dental care. -  augmentin BID x7 days.  - peridex solution daily and continue dental care with brushing, flossing, dental pic F/u PRN   Reviewed expectations re: course of current medical issues.  Discussed self-management of symptoms.  Outlined signs and symptoms indicating need for more acute intervention.  Patient verbalized understanding and all questions were answered.  Patient received an After-Visit Summary.    No orders of the defined types were placed in this encounter.    Note is dictated utilizing voice recognition software. Although note has been proof read prior to signing, occasional typographical errors still can be missed. If any questions arise, please do not hesitate to call for verification.   electronically signed by:  Howard Pouch, DO  [place]

## 2018-10-21 NOTE — Patient Instructions (Signed)
Start Augmentin every 12 hours for 7 days with food.  Mouth swish daily to help keep infection controled while waiting on dental appt.     Dental Abscess  A dental abscess is an area of pus in or around a tooth. It comes from an infection. It can cause pain and other symptoms. Treatment will help with symptoms and prevent the infection from spreading. Follow these instructions at home: Medicines  Take over-the-counter and prescription medicines only as told by your dentist.  If you were prescribed an antibiotic medicine, take it as told by your dentist. Do not stop taking it even if you start to feel better.  If you were prescribed a gel that has numbing medicine in it, use it exactly as told.  Do not drive or use heavy machinery (like a Conservation officer, nature) while taking prescription pain medicine. General instructions  Rinse out your mouth often with salt water. ? To make salt water, dissolve -1 tsp of salt in 1 cup of warm water.  Eat a soft diet while your mouth is healing.  Drink enough fluid to keep your urine pale yellow.  Do not apply heat to the outside of your mouth.  Do not use any products that contain nicotine or tobacco. These include cigarettes and e-cigarettes. If you need help quitting, ask your doctor.  Keep all follow-up visits as told by your dentist. This is important. Prevent an abscess  Brush your teeth every morning and every night. Use fluoride toothpaste.  Floss your teeth each day.  Get dental cleanings as often as told by your dentist.  Think about getting dental sealant put on teeth that have deep holes (decay).  Drink water that has fluoride in it. ? Most tap water has fluoride. ? Check the label on bottled water to see if it has fluoride in it.  Drink water instead of sugary drinks.  Eat healthy meals and snacks.  Wear a mouth guard or face shield when you play sports. Contact a doctor if:  Your pain is worse, and medicine does not help. Get  help right away if:  You have a fever or chills.  Your symptoms suddenly get worse.  You have a very bad headache.  You have problems breathing or swallowing.  You have trouble opening your mouth.  You have swelling in your neck or close to your eye. Summary  A dental abscess is an area of pus in or around a tooth. It is caused by an infection.  Treatment will help with symptoms and prevent the infection from spreading.  Take over-the-counter and prescription medicines only as told by your dentist.  To prevent an abscess, take good care of your teeth. Brush your teeth every morning and night. Use floss every day.  Get dental cleanings as often as told by your dentist. This information is not intended to replace advice given to you by your health care provider. Make sure you discuss any questions you have with your health care provider. Document Released: 06/26/2014 Document Revised: 06/01/2018 Document Reviewed: 10/12/2016 Elsevier Patient Education  2020 [employer].

## 2018-10-27 ENCOUNTER — Encounter: Payer: Self-pay | Admitting: Family Medicine

## 2018-10-27 MED ORDER — AMOXICILLIN-POT CLAVULANATE 875-125 MG PO TABS: 1.0000 | ORAL_TABLET | Freq: Two times a day (BID) | ORAL | 0 refills | Status: AC

## 2018-10-27 NOTE — Telephone Encounter (Signed)
Pt wrote my chart message saying she is to take her last abx pill this am and is still having pus. Does she need another appt? Please advise.

## 2018-10-27 NOTE — Telephone Encounter (Signed)
I will extend one more short course of abx- if she has an abscess that is too large,  the abx will not cover it well enough. In that case she needs to to be seen by a dental specialist that can drain the area for her. We can not continue to provide abx if they are not resolving the issue.  I know she is waiting an appt with [facility]- dental clinic, if she can not get in soon she should peruse an appt with a specialist that will allow her to make payments on their services.

## 2018-11-04 ENCOUNTER — Encounter: Payer: Self-pay | Admitting: Family Medicine
